# Patient Record
Sex: Female | Born: 2000 | Race: Black or African American | Hispanic: No | Marital: Single | Smoking: Never smoker
Health system: Southern US, Community
[De-identification: ages and names within clinical notes are randomized; demographics above are authoritative.]

---

## 2018-03-31 ENCOUNTER — Encounter (HOSPITAL_COMMUNITY): Payer: Self-pay | Admitting: *Deleted

## 2018-03-31 ENCOUNTER — Emergency Department (HOSPITAL_COMMUNITY)
Admission: EM | Admit: 2018-03-31 | Discharge: 2018-03-31 | Disposition: A | Discharge status: Home / Self Care | Payer: Self-pay | Attending: Emergency Medicine | Admitting: Emergency Medicine

## 2018-03-31 ENCOUNTER — Emergency Department (HOSPITAL_COMMUNITY): Payer: Self-pay

## 2018-03-31 ENCOUNTER — Other Ambulatory Visit: Payer: Self-pay

## 2018-03-31 DIAGNOSIS — R109 Unspecified abdominal pain: Secondary | ICD-10-CM

## 2018-03-31 DIAGNOSIS — Z79899 Other long term (current) drug therapy: Secondary | ICD-10-CM | POA: Insufficient documentation

## 2018-03-31 DIAGNOSIS — R1012 Left upper quadrant pain: Secondary | ICD-10-CM | POA: Insufficient documentation

## 2018-03-31 LAB — URINALYSIS, ROUTINE W REFLEX MICROSCOPIC
Bilirubin Urine: NEGATIVE
GLUCOSE, UA: NEGATIVE mg/dL
Hgb urine dipstick: NEGATIVE
Ketones, ur: NEGATIVE mg/dL
Leukocytes,Ua: NEGATIVE
Nitrite: NEGATIVE
PROTEIN: NEGATIVE mg/dL
Specific Gravity, Urine: 1.015 (ref 1.005–1.030)
pH: 8 (ref 5.0–8.0)

## 2018-03-31 LAB — CBC WITH DIFFERENTIAL/PLATELET
Abs Immature Granulocytes: 0.04 10*3/uL (ref 0.00–0.07)
Basophils Absolute: 0 10*3/uL (ref 0.0–0.1)
Basophils Relative: 0 %
Eosinophils Absolute: 0 10*3/uL (ref 0.0–0.5)
Eosinophils Relative: 1 %
HCT: 39.7 % (ref 36.0–46.0)
HEMOGLOBIN: 12.7 g/dL (ref 12.0–15.0)
Immature Granulocytes: 1 %
LYMPHS PCT: 34 %
Lymphs Abs: 3 10*3/uL (ref 0.7–4.0)
MCH: 29.2 pg (ref 26.0–34.0)
MCHC: 32 g/dL (ref 30.0–36.0)
MCV: 91.3 fL (ref 80.0–100.0)
Monocytes Absolute: 0.5 10*3/uL (ref 0.1–1.0)
Monocytes Relative: 6 %
NEUTROS ABS: 5.2 10*3/uL (ref 1.7–7.7)
Neutrophils Relative %: 58 %
Platelets: 272 10*3/uL (ref 150–400)
RBC: 4.35 MIL/uL (ref 3.87–5.11)
RDW: 13.6 % (ref 11.5–15.5)
WBC: 8.8 10*3/uL (ref 4.0–10.5)
nRBC: 0 % (ref 0.0–0.2)

## 2018-03-31 LAB — POC URINE PREG, ED: Preg Test, Ur: NEGATIVE

## 2018-03-31 LAB — COMPREHENSIVE METABOLIC PANEL
ALT: 16 U/L (ref 0–44)
AST: 17 U/L (ref 15–41)
Albumin: 4.6 g/dL (ref 3.5–5.0)
Alkaline Phosphatase: 57 U/L (ref 38–126)
Anion gap: 9 (ref 5–15)
BILIRUBIN TOTAL: 0.3 mg/dL (ref 0.3–1.2)
BUN: 13 mg/dL (ref 6–20)
CO2: 25 mmol/L (ref 22–32)
Calcium: 9.3 mg/dL (ref 8.9–10.3)
Chloride: 104 mmol/L (ref 98–111)
Creatinine, Ser: 0.87 mg/dL (ref 0.44–1.00)
GFR calc Af Amer: >60 mL/min (ref >60)
Glucose, Bld: 91 mg/dL (ref 70–99)
POTASSIUM: 3.4 mmol/L — AB (ref 3.5–5.1)
Sodium: 138 mmol/L (ref 135–145)
Total Protein: 8.2 g/dL — ABNORMAL HIGH (ref 6.5–8.1)

## 2018-03-31 MED ORDER — SODIUM CHLORIDE 0.9 % IV BOLUS
1000.0000 mL | Freq: Once | INTRAVENOUS | Status: AC
  Administered 2018-03-31: 1000 mL via INTRAVENOUS

## 2018-03-31 MED ORDER — IOPAMIDOL (ISOVUE-300) INJECTION 61%
100.0000 mL | Freq: Once | INTRAVENOUS | Status: AC | PRN
  Administered 2018-03-31: 100 mL via INTRAVENOUS

## 2018-03-31 MED ORDER — ONDANSETRON 4 MG PO TBDP: 4.0000 mg | ORAL_TABLET | Freq: Three times a day (TID) | ORAL | 0 refills | Status: DC | PRN

## 2018-03-31 NOTE — ED Triage Notes (Signed)
Pt reports LUQ pain x 1 week, then started to have pain in her L flank area x 3 days ago with nausea but no vomiting.  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  She also endorses hematuria today.

## 2018-03-31 NOTE — Discharge Instructions (Addendum)
Follow-up with the urologist as soon as possible in this manner call the number provided to set up an appointment. Be sure to drink plenty of water to stay well-hydrated. Antiinflammatory medications: Take 600 mg of ibuprofen every 6 hours or 440 mg (over the counter dose) to 500 mg (prescription dose) of naproxen every 12 hours for the next 3 days. After this time, these medications may be used as needed for pain. Take these medications with food to avoid upset stomach. Choose only one of these medications, do not take them together. Acetaminophen (generic for Tylenol): Should you continue to have additional pain while taking the ibuprofen or naproxen, you may add in acetaminophen as needed. Your daily total maximum amount of acetaminophen from all sources should be limited to 4000mg /day for persons without liver problems, or 2000mg /day for those with liver problems.  Nausea/vomiting: Use the ondansetron (generic for Zofran) for nausea or vomiting.  This medication may not prevent all vomiting or nausea, but can help facilitate better hydration. Things that can help with nausea/vomiting also include peppermint/menthol candies, vitamin B12, and ginger.

## 2018-03-31 NOTE — ED Provider Notes (Signed)
[facility]-EMERGENCY DEPT Provider Note   CSN: [id number] Arrival date & time: 03/31/18  1306     History   Chief Complaint Chief Complaint  Patient presents with  . Abdominal Pain  . Flank Pain    HPI Glenda Jordan is a 18 y.o. female.  HPI   Glenda Jordan is a 18 y.o. female, with a history of renal cyst, presenting to the ED with left lower back pain for the last week.  Accompanied by intermittent hematuria with streaks of blood, but no clots.  She also notes that she may have had some blood in her stool that resolved, but is not sure.  Accompanied by nausea.  Pain waxes and wanes, sharp, moderate to severe, radiating to left flank and left abdomen.  She states she has a history of a renal cyst and wonders if this could be causing her symptoms.  She has had similar symptoms in the past and [place]rolog[place]  She is student at [employer] and she has not contacted her nephrologist about this episode.  She is vague and does not seem to be very clear as to whether or not her nephrologist told her the source of her previous pain and hematuria.   Denies fever/chills, vomiting, diarrhea, chest pain, shortness of breath, abnormal vaginal discharge/bleeding, dysuria, or any other complaints.    History reviewed. No pertinent past medical history.  There are no active problems to display for this patient.   History reviewed. No pertinent surgical history.   OB History   No obstetric history on file.      Home Medications    Prior to Admission medications   Medication Sig Start Date End Date Taking? Authorizing Provider  acetaminophen (TYLENOL) 500 MG tablet Take 500 mg by mouth daily after breakfast.   Yes [provider]  cyclobenzaprine (FLEXERIL) 10 MG tablet Take 10 mg by mouth at bedtime. 10/07/17  Yes [provider]  ondansetron (ZOFRAN ODT) 4 MG disintegrating tablet Take 1 tablet (4 mg  total) by mouth every 8 (eight) hours as needed for nausea or vomiting. 03/31/18   Ellerie Arenz, Hillard Danker, PA-C    Family History No family history on file.  Social History Social History   Tobacco Use  . Smoking status: Never Smoker  . Smokeless tobacco: Never Used  Substance Use Topics  . Alcohol use: Never    Frequency: Never  . Drug use: Never     Allergies   Amoxicillin   Review of Systems Review of Systems  Constitutional: Negative for chills, diaphoresis and fever.  Respiratory: Negative for cough and shortness of breath.   Cardiovascular: Negative for chest pain.  Gastrointestinal: Positive for blood in stool (?) and nausea. Negative for abdominal distention, diarrhea and vomiting.  Genitourinary: Positive for flank pain and hematuria. Negative for dysuria, frequency, vaginal bleeding and vaginal discharge.  Musculoskeletal: Positive for back pain.  Neurological: Negative for syncope, weakness and numbness.  All other systems reviewed and are negative.    Physical Exam Updated Vital Signs BP (!) 133/96 (BP Location: Left Arm)   Pulse (!) 105   Temp 98.8 F (37.1 C) (Oral)   Resp 18   Ht 5\' 3"  (1.6 m)   Wt 80.3 kg   SpO2 100%   BMI 31.35 kg/m   Physical Exam Vitals signs and nursing note reviewed.  Constitutional:      General: She is not in acute  distress.    Appearance: She is well-developed. She is not diaphoretic.  HENT:     Head: Normocephalic and atraumatic.     Mouth/Throat:     Mouth: Mucous membranes are moist.     Pharynx: Oropharynx is clear.  Eyes:     Conjunctiva/sclera: Conjunctivae normal.  Neck:     Musculoskeletal: Neck supple.  Cardiovascular:     Rate and Rhythm: Regular rhythm. Tachycardia present.     Pulses: Normal pulses.     Heart sounds: Normal heart sounds.     Comments: Mildly tachycardic Pulmonary:     Effort: Pulmonary effort is normal. No respiratory distress.     Breath sounds: Normal breath sounds.  Abdominal:      Palpations: Abdomen is soft.     Tenderness: There is abdominal tenderness. There is no guarding.       Comments: Seemingly mild tenderness to the left flank.  Question of left CVA tenderness.  Patient is not very clear about whether or not percussion is painful.  Musculoskeletal:     Right lower leg: No edema.     Left lower leg: No edema.  Lymphadenopathy:     Cervical: No cervical adenopathy.  Skin:    General: Skin is warm and dry.  Neurological:     Mental Status: She is alert.  Psychiatric:        Mood and Affect: Mood and affect normal.        Speech: Speech normal.        Behavior: Behavior normal.      ED Treatments / Results  Labs (all labs ordered are listed, but only abnormal results are displayed) Labs Reviewed  URINALYSIS, ROUTINE W REFLEX MICROSCOPIC - Abnormal; Notable for the following components:      Result Value   APPearance CLOUDY (*)    All other components within normal limits  COMPREHENSIVE METABOLIC PANEL - Abnormal; Notable for the following components:   Potassium 3.4 (*)    Total Protein 8.2 (*)    All other components within normal limits  CBC WITH DIFFERENTIAL/PLATELET  POC URINE PREG, ED    EKG None  Radiology Ct Abdomen Pelvis W Contrast  Result Date: 03/31/2018 CLINICAL DATA:  Left upper quadrant pain for 1 week EXAM: CT ABDOMEN AND PELVIS WITH CONTRAST TECHNIQUE: Multidetector CT imaging of the abdomen and pelvis was performed using the standard protocol following bolus administration of intravenous contrast. CONTRAST:  ISOVUE-300 IOPAMIDOL (ISOVUE-300) INJECTION 61% COMPARISON:  None. FINDINGS: Lower chest: No acute abnormality. Hepatobiliary: Liver is within normal limits. The gallbladder is decompressed. Pancreas: Unremarkable. No pancreatic ductal dilatation or surrounding inflammatory changes. Spleen: Normal in size without focal abnormality. Adrenals/Urinary Tract: Adrenal glands are within normal limits bilaterally. Kidneys  are well visualized bilaterally. Left kidney demonstrates no renal calculi or obstructive changes. Bladder is partially distended. Right kidney demonstrates and outpouching of fluid attenuation with surrounding enhancing cortex. Some adjacent scarring is noted. Delayed images show opacification of this cystic area similar to that seen on the collecting system. 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 Stomach/Bowel: Colon is well distended with mild retained fecal material. No obstructive changes are seen. The appendix is well visualized and within normal limits. No small bowel or gastric abnormality is seen. Vascular/Lymphatic: No significant vascular findings are present. No enlarged abdominal or pelvic lymph nodes. Reproductive: Uterus and bilateral adnexa are unremarkable. Other: No abdominal wall hernia or abnormality. No abdominopelvic ascites. Musculoskeletal: No acute or significant osseous findings.  IMPRESSION: No findings to correspond with the patient's given clinical history of left-sided abdominal pain. 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 No other focal abnormality is noted. Electronically Signed   By: Alcide CleverMark  Lukens M.D.   On: 03/31/2018 20:42    Procedures Procedures (including critical care time)  Medications Ordered in ED Medications  sodium chloride 0.9 % bolus 1,000 mL (0 mLs Intravenous Stopped 03/31/18 2125)  iopamidol (ISOVUE-300) 61 % injection 100 mL (100 mLs Intravenous Contrast Given 03/31/18 2002)     Initial Impression / Assessment and Plan / ED Course  I have reviewed the triage vital signs and the nursing notes.  Pertinent labs & imaging results that were available during my care of the patient were reviewed by me and considered in my medical decision making (see chart for details).     Patient presents with left flank pain and reported hematuria. Patient is nontoxic appearing, afebrile,  not tachycardic, not tachypneic, not hypotensive, maintains excellent SPO2 on room air, and is in no apparent distress.  No leukocytosis.  Normal kidney function.  No hematuria. 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  No pain or symptoms on the right side. Urology follow-up.  Return precautions discussed. Patient voices understanding of these instructions, accepts the plan, and is comfortable with discharge.  Findings and plan of care discussed with Melene Planan Floyd, DO.   Vitals:   03/31/18 1413 03/31/18 1416 03/31/18 1601 03/31/18 2158  BP:  134/89 (!) 133/96 130/88  Pulse:   (!) 105 68  Resp:   18 16  Temp:      TempSrc:      SpO2:   100% 100%  Weight: 80.3 kg     Height: 5\' 3"  (1.6 m)        Final Clinical Impressions(s) / ED Diagnoses   Final diagnoses:  Flank pain    ED Discharge Orders         Ordered    ondansetron (ZOFRAN ODT) 4 MG disintegrating tablet  Every 8 hours PRN     03/31/18 2123           Anselm PancoastJoy, Jamarious Febo C, PA-C 03/31/18 2259    Melene PlanFloyd, Dan, DO 03/31/18 2317

## 2018-03-31 NOTE — ED Notes (Signed)
Patient ambulated to restroom and back to stretcher. Patient reports she voided with no complications. Patient was bladder scanned multiple times with no no urine in bladder.

## 2018-11-24 ENCOUNTER — Other Ambulatory Visit: Payer: Self-pay

## 2018-11-24 ENCOUNTER — Emergency Department (HOSPITAL_COMMUNITY)
Admission: EM | Admit: 2018-11-24 | Discharge: 2018-11-24 | Discharge status: Absent without leave | Payer: Medicaid Other

## 2018-11-24 NOTE — ED Notes (Signed)
I called patient for a second time in the lobby to be triage

## 2018-11-24 NOTE — ED Notes (Signed)
Called patient in the lobby to be triage and no one responded

## 2018-11-25 ENCOUNTER — Encounter (HOSPITAL_COMMUNITY): Payer: Self-pay | Admitting: Emergency Medicine

## 2018-11-25 ENCOUNTER — Emergency Department (HOSPITAL_COMMUNITY)
Admission: EM | Admit: 2018-11-25 | Discharge: 2018-11-25 | Discharge status: Against Medical Advice | Payer: Self-pay | Attending: Emergency Medicine | Admitting: Emergency Medicine

## 2018-11-25 ENCOUNTER — Other Ambulatory Visit: Payer: Self-pay

## 2018-11-25 DIAGNOSIS — Z5321 Procedure and treatment not carried out due to patient leaving prior to being seen by health care provider: Secondary | ICD-10-CM | POA: Insufficient documentation

## 2018-11-25 MED ORDER — SODIUM CHLORIDE 0.9% FLUSH: 3.0000 mL | Freq: Once | INTRAVENOUS | Status: DC

## 2018-11-25 NOTE — ED Triage Notes (Signed)
Pt reports abd pains since Sunday with no other symptoms.

## 2018-12-21 ENCOUNTER — Other Ambulatory Visit: Payer: Self-pay

## 2018-12-21 ENCOUNTER — Emergency Department (HOSPITAL_COMMUNITY)
Admission: EM | Admit: 2018-12-21 | Discharge: 2018-12-21 | Disposition: A | Discharge status: Home / Self Care | Payer: Self-pay | Attending: Emergency Medicine | Admitting: Emergency Medicine

## 2018-12-21 ENCOUNTER — Encounter (HOSPITAL_COMMUNITY): Payer: Self-pay | Admitting: *Deleted

## 2018-12-21 DIAGNOSIS — J069 Acute upper respiratory infection, unspecified: Secondary | ICD-10-CM | POA: Insufficient documentation

## 2018-12-21 DIAGNOSIS — H9203 Otalgia, bilateral: Secondary | ICD-10-CM | POA: Insufficient documentation

## 2018-12-21 DIAGNOSIS — R0981 Nasal congestion: Secondary | ICD-10-CM | POA: Insufficient documentation

## 2018-12-21 DIAGNOSIS — R42 Dizziness and giddiness: Secondary | ICD-10-CM | POA: Insufficient documentation

## 2018-12-21 MED ORDER — FLUTICASONE PROPIONATE 50 MCG/ACT NA SUSP: 1.0000 | Freq: Every day | NASAL | 2 refills | Status: AC

## 2018-12-21 MED ORDER — CETIRIZINE-PSEUDOEPHEDRINE ER 5-120 MG PO TB12: 1.0000 | ORAL_TABLET | Freq: Every day | ORAL | 0 refills | Status: AC

## 2018-12-21 MED ORDER — DEXAMETHASONE 1 MG/ML PO CONC
10.0000 mg | Freq: Once | ORAL | Status: AC
  Administered 2018-12-21: 10 mg via ORAL
  Filled 2018-12-21: qty 10

## 2018-12-21 MED ORDER — IBUPROFEN 100 MG/5ML PO SUSP
800.0000 mg | Freq: Once | ORAL | Status: AC
  Administered 2018-12-21: 19:00:00 800 mg via ORAL
  Filled 2018-12-21: qty 40

## 2018-12-21 NOTE — ED Triage Notes (Signed)
Pt states she has had a sore throat since last Thursday. She went to the [facility] [employer] today, tested neg for strep and Covid 19. She spoke with her mother and since they prescribe any meds, for her to come to the ED

## 2018-12-21 NOTE — Discharge Instructions (Signed)
Take Flonase and Zyrtec daily. Take Motrin and Tylenol as needed as directed. Follow-up with student [facility] if symptoms continue.

## 2018-12-21 NOTE — ED Provider Notes (Signed)
[facility] DEPT Provider Note   CSN: [id number] Arrival date & time: 12/21/18  1729     History   Chief Complaint Chief Complaint  Patient presents with  . Sore Throat    HPI Glenda Jordan is a 18 y.o. female.     18 year old female presents with complaint of sore throat, nonproductive cough, sinus congestion, ear pressure.  Patient went to [facility] today and had a negative Covid test, negative mono test, negative strep test with throat culture pending.  Patient reports onset of symptoms 10 days ago, no known sick contacts.  Patient states that she was at work today when she felt a little dizzy, called her mom and was told to go to the emergency room for evaluation.  Patient states dizziness has resolved, continues with previously mentioned symptoms.  Glenda Jordan was evaluated in Emergency Department on 12/21/2018 for the symptoms described in the history of present illness. She was evaluated in the context of the global COVID-19 pandemic, which necessitated consideration that the patient might be at risk for infection with the SARS-CoV-2 virus that causes COVID-19. Institutional protocols and algorithms that pertain to the evaluation of patients at risk for COVID-19 are in a state of rapid change based on information released by regulatory bodies including the CDC and federal and state organizations. These policies and algorithms were followed during the patient's care in the ED.      History reviewed. No pertinent past medical history.  There are no active problems to display for this patient.   History reviewed. No pertinent surgical history.   OB History   No obstetric history on file.      Home Medications    Prior to Admission medications   Medication Sig Start Date End Date Taking? Authorizing Provider  acetaminophen (TYLENOL) 500 MG tablet Take 500 mg by mouth daily after breakfast.    [provider]   cetirizine-pseudoephedrine (ZYRTEC-D) 5-120 MG tablet Take 1 tablet by mouth daily. 12/21/18   Tacy Learn, PA-C  cyclobenzaprine (FLEXERIL) 10 MG tablet Take 10 mg by mouth at bedtime. 10/07/17   [provider]  fluticasone (FLONASE) 50 MCG/ACT nasal spray Place 1 spray into both nostrils daily. 12/21/18   Tacy Learn, PA-C  ondansetron (ZOFRAN ODT) 4 MG disintegrating tablet Take 1 tablet (4 mg total) by mouth every 8 (eight) hours as needed for nausea or vomiting. 03/31/18   Joy, Helane Gunther, PA-C    Family History No family history on file.  Social History Social History   Tobacco Use  . Smoking status: Never Smoker  . Smokeless tobacco: Never Used  Substance Use Topics  . Alcohol use: Never    Frequency: Never  . Drug use: Never     Allergies   Amoxicillin   Review of Systems Review of Systems  Constitutional: Negative for chills, diaphoresis and fever.  HENT: Positive for congestion, ear pain, sinus pressure and sore throat. Negative for sneezing.   Respiratory: Positive for cough.   Gastrointestinal: Negative for nausea and vomiting.  Musculoskeletal: Negative for back pain and myalgias.  Skin: Negative for rash and wound.  Allergic/Immunologic: Negative for immunocompromised state.  Neurological: Positive for dizziness. Negative for weakness and headaches.  Psychiatric/Behavioral: Negative for confusion.  All other systems reviewed and are negative.    Physical Exam Updated Vital Signs BP (!) 128/95 (BP Location: Left Arm)   Pulse 89   Temp 98.3 F (36.8 C)   Resp 17  SpO2 98%   Physical Exam Vitals signs and nursing note reviewed.  Constitutional:      General: She is not in acute distress.    Appearance: She is well-developed. She is not diaphoretic.  HENT:     Head: Normocephalic and atraumatic.     Right Ear: Ear canal normal. A middle ear effusion is present. Tympanic membrane is not erythematous.     Left Ear: Ear canal normal. A  middle ear effusion is present. Tympanic membrane is not erythematous.     Nose: Congestion present.     Mouth/Throat:     Mouth: Mucous membranes are moist.     Pharynx: Uvula midline. No oropharyngeal exudate or uvula swelling.     Tonsils: No tonsillar exudate. 1+ on the right. 1+ on the left.  Eyes:     Conjunctiva/sclera: Conjunctivae normal.  Neck:     Musculoskeletal: Neck supple.  Cardiovascular:     Rate and Rhythm: Normal rate and regular rhythm.     Heart sounds: Normal heart sounds.  Pulmonary:     Effort: Pulmonary effort is normal.     Breath sounds: Normal breath sounds.  Lymphadenopathy:     Cervical: No cervical adenopathy.  Skin:    General: Skin is warm and dry.     Findings: No rash.  Neurological:     Mental Status: She is alert and oriented to person, place, and time.  Psychiatric:        Behavior: Behavior normal.      ED Treatments / Results  Labs (all labs ordered are listed, but only abnormal results are displayed) Labs Reviewed - No data to display  EKG None  Radiology No results found.  Procedures Procedures (including critical care time)  Medications Ordered in ED Medications  ibuprofen (ADVIL) 100 MG/5ML suspension 800 mg (has no administration in time range)  dexamethasone (DECADRON) 1 MG/ML solution 10 mg (has no administration in time range)     Initial Impression / Assessment and Plan / ED Course  I have reviewed the triage vital signs and the nursing notes.  Pertinent labs & imaging results that were available during my care of the patient were reviewed by me and considered in my medical decision making (see chart for details).  Clinical Course as of Dec 21 1815  Mon Dec 21, 2018  3875 18 year old female presents with complaint of URI symptoms x10 days.  Patient went to [facility] today and had a negative Covid, strep, mono test today.  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  Dizziness has resolved.  On exam patient has bilateral ear effusions without acute otitis media.  Suspect viral URI.  Will give prescription for Flonase and Zyrtec-D, also given dose of Motrin and Decadron while in the ER.  Patient advised to palpation help if her symptoms persist.   [LM]    Clinical Course User Index [LM] Jeannie Fend, PA-C      Final Clinical Impressions(s) / ED Diagnoses   Final diagnoses:  Viral upper respiratory tract infection    ED Discharge Orders         Ordered    cetirizine-pseudoephedrine (ZYRTEC-D) 5-120 MG tablet  Daily     12/21/18 1816    fluticasone (FLONASE) 50 MCG/ACT nasal spray  Daily     12/21/18 1816  Jeannie FendMurphy, Maite Burlison A, PA-C 12/21/18 Celestia [id number]    Knapp, Jon, MD 12/22/18 [phone]

## 2019-03-17 ENCOUNTER — Encounter (HOSPITAL_COMMUNITY): Payer: Self-pay | Admitting: Emergency Medicine

## 2019-03-17 ENCOUNTER — Other Ambulatory Visit: Payer: Self-pay

## 2019-03-17 DIAGNOSIS — Z79899 Other long term (current) drug therapy: Secondary | ICD-10-CM | POA: Insufficient documentation

## 2019-03-17 DIAGNOSIS — R109 Unspecified abdominal pain: Secondary | ICD-10-CM | POA: Insufficient documentation

## 2019-03-17 DIAGNOSIS — R112 Nausea with vomiting, unspecified: Secondary | ICD-10-CM | POA: Insufficient documentation

## 2019-03-17 DIAGNOSIS — R197 Diarrhea, unspecified: Secondary | ICD-10-CM | POA: Insufficient documentation

## 2019-03-17 DIAGNOSIS — Z20822 Contact with and (suspected) exposure to covid-19: Secondary | ICD-10-CM | POA: Insufficient documentation

## 2019-03-17 MED ORDER — SODIUM CHLORIDE 0.9% FLUSH
3.0000 mL | Freq: Once | INTRAVENOUS | Status: AC
  Administered 2019-03-18: 3 mL via INTRAVENOUS

## 2019-03-17 NOTE — ED Triage Notes (Signed)
Patient complaining of abdominal pain, vomiting, and nausea. Patient states this started on Monday.

## 2019-03-18 ENCOUNTER — Emergency Department (HOSPITAL_COMMUNITY): Payer: Self-pay

## 2019-03-18 ENCOUNTER — Emergency Department (HOSPITAL_COMMUNITY)
Admission: EM | Admit: 2019-03-18 | Discharge: 2019-03-18 | Disposition: A | Discharge status: Home / Self Care | Payer: Self-pay | Attending: Emergency Medicine | Admitting: Emergency Medicine

## 2019-03-18 DIAGNOSIS — R112 Nausea with vomiting, unspecified: Secondary | ICD-10-CM

## 2019-03-18 LAB — URINALYSIS, ROUTINE W REFLEX MICROSCOPIC
Bilirubin Urine: NEGATIVE
Glucose, UA: NEGATIVE mg/dL
Hgb urine dipstick: NEGATIVE
Ketones, ur: NEGATIVE mg/dL
Leukocytes,Ua: NEGATIVE
Nitrite: NEGATIVE
Protein, ur: 30 mg/dL — AB
Specific Gravity, Urine: 1.027 (ref 1.005–1.030)
pH: 6 (ref 5.0–8.0)

## 2019-03-18 LAB — COMPREHENSIVE METABOLIC PANEL
ALT: 17 U/L (ref 0–44)
AST: 16 U/L (ref 15–41)
Albumin: 4.3 g/dL (ref 3.5–5.0)
Alkaline Phosphatase: 54 U/L (ref 38–126)
Anion gap: 9 (ref 5–15)
BUN: 11 mg/dL (ref 6–20)
CO2: 25 mmol/L (ref 22–32)
Calcium: 9.1 mg/dL (ref 8.9–10.3)
Chloride: 105 mmol/L (ref 98–111)
Creatinine, Ser: 0.97 mg/dL (ref 0.44–1.00)
GFR calc Af Amer: >60 mL/min (ref >60)
GFR calc non Af Amer: >60 mL/min (ref >60)
Glucose, Bld: 91 mg/dL (ref 70–99)
Potassium: 3.5 mmol/L (ref 3.5–5.1)
Sodium: 139 mmol/L (ref 135–145)
Total Bilirubin: 0.6 mg/dL (ref 0.3–1.2)
Total Protein: 7.6 g/dL (ref 6.5–8.1)

## 2019-03-18 LAB — CBC
HCT: 36.7 % (ref 36.0–46.0)
Hemoglobin: 12.4 g/dL (ref 12.0–15.0)
MCH: 30.8 pg (ref 26.0–34.0)
MCHC: 33.8 g/dL (ref 30.0–36.0)
MCV: 91.1 fL (ref 80.0–100.0)
Platelets: 245 10*3/uL (ref 150–400)
RBC: 4.03 MIL/uL (ref 3.87–5.11)
RDW: 13.1 % (ref 11.5–15.5)
WBC: 9 10*3/uL (ref 4.0–10.5)
nRBC: 0 % (ref 0.0–0.2)

## 2019-03-18 LAB — I-STAT BETA HCG BLOOD, ED (MC, WL, AP ONLY): I-stat hCG, quantitative: <5 m[IU]/mL (ref <5)

## 2019-03-18 LAB — LIPASE, BLOOD: Lipase: 29 U/L (ref 11–51)

## 2019-03-18 LAB — URINE CULTURE

## 2019-03-18 MED ORDER — SODIUM CHLORIDE 0.9 % IV BOLUS
1000.0000 mL | Freq: Once | INTRAVENOUS | Status: AC
  Administered 2019-03-18: 1000 mL via INTRAVENOUS

## 2019-03-18 MED ORDER — ONDANSETRON HCL 4 MG/2ML IJ SOLN
4.0000 mg | Freq: Once | INTRAMUSCULAR | Status: AC
  Administered 2019-03-18: 4 mg via INTRAVENOUS
  Filled 2019-03-18: qty 2

## 2019-03-18 MED ORDER — ONDANSETRON 4 MG PO TBDP: 4.0000 mg | ORAL_TABLET | Freq: Three times a day (TID) | ORAL | 0 refills | Status: AC | PRN

## 2019-03-18 NOTE — ED Provider Notes (Signed)
[facility]-EMERGENCY DEPT Provider Note   CSN: [id number] Arrival date & time: 03/17/19  2333     History Chief Complaint  Patient presents with  . Nausea  . Abdominal Pain    Glenda Jordan is a 19 y.o. female.  The history is provided by the patient and medical records. No language interpreter was used.  Emesis Severity:  Severe Duration:  4 days Timing:  Constant Quality:  Stomach contents Progression:  Unchanged Chronicity:  New Recent urination:  Normal Relieved by:  Nothing Worsened by:  Nothing Ineffective treatments:  None tried Associated symptoms: abdominal pain, chills, cough, diarrhea, headaches and URI   Associated symptoms: no fever and no sore throat        History reviewed. No pertinent past medical history.  There are no problems to display for this patient.   History reviewed. No pertinent surgical history.   OB History   No obstetric history on file.     History reviewed. No pertinent family history.  Social History   Tobacco Use  . Smoking status: Never Smoker  . Smokeless tobacco: Never Used  Substance Use Topics  . Alcohol use: Never  . Drug use: Never    Home Medications Prior to Admission medications   Medication Sig Start Date End Date Taking? Authorizing Provider  acetaminophen (TYLENOL) 500 MG tablet Take 500 mg by mouth daily after breakfast.    [provider]  cetirizine-pseudoephedrine (ZYRTEC-D) 5-120 MG tablet Take 1 tablet by mouth daily. 12/21/18   Jeannie Fend, PA-C  cyclobenzaprine (FLEXERIL) 10 MG tablet Take 10 mg by mouth at bedtime. 10/07/17   [provider]  fluticasone (FLONASE) 50 MCG/ACT nasal spray Place 1 spray into both nostrils daily. 12/21/18   Jeannie Fend, PA-C  ondansetron (ZOFRAN ODT) 4 MG disintegrating tablet Take 1 tablet (4 mg total) by mouth every 8 (eight) hours as needed for nausea or vomiting. 03/31/18   Joy, Shawn C, PA-C    Allergies      Amoxicillin and Other  Review of Systems   Review of Systems  Constitutional: Positive for chills and fatigue. Negative for diaphoresis and fever.  HENT: Negative for congestion and sore throat.   Eyes: Negative for visual disturbance.  Respiratory: Positive for cough. Negative for chest tightness, shortness of breath, wheezing and stridor.   Cardiovascular: Negative for chest pain, palpitations and leg swelling.  Gastrointestinal: Positive for abdominal pain, diarrhea, nausea and vomiting. Negative for constipation.  Genitourinary: Negative for dysuria.  Musculoskeletal: Negative for back pain and neck pain.  Neurological: Positive for headaches. Negative for dizziness, weakness and light-headedness.  Psychiatric/Behavioral: Negative for agitation.  All other systems reviewed and are negative.   Physical Exam Updated Vital Signs BP (!) 154/99 (BP Location: Left Arm)   Pulse 95   Temp 98.1 F (36.7 C) (Oral)   Resp 19   Ht 5\' 3"  (1.6 m)   Wt 89.8 kg   SpO2 100%   BMI 35.07 kg/m   Physical Exam Vitals and nursing note reviewed.  Constitutional:      General: She is not in acute distress.    Appearance: She is well-developed. She is not ill-appearing, toxic-appearing or diaphoretic.  HENT:     Head: Normocephalic and atraumatic.     Right Ear: External ear normal.     Left Ear: External ear normal.     Nose: Nose normal.     Mouth/Throat:     Mouth: Mucous membranes are  moist.     Pharynx: No pharyngeal swelling or oropharyngeal exudate.  Eyes:     General: No scleral icterus.    Extraocular Movements: Extraocular movements intact.     Conjunctiva/sclera: Conjunctivae normal.     Pupils: Pupils are equal, round, and reactive to light.  Cardiovascular:     Rate and Rhythm: Normal rate.     Heart sounds: Normal heart sounds. No murmur.  Pulmonary:     Effort: Pulmonary effort is normal. No respiratory distress.     Breath sounds: No stridor. No wheezing, rhonchi or  rales.  Chest:     Chest wall: No tenderness.  Abdominal:     General: Bowel sounds are normal. There is no distension.     Palpations: Abdomen is soft.     Tenderness: There is no abdominal tenderness. There is no right CVA tenderness, left CVA tenderness, guarding or rebound.  Musculoskeletal:     Cervical back: Normal range of motion and neck supple.  Skin:    General: Skin is warm.     Findings: No erythema or rash.  Neurological:     General: No focal deficit present.     Mental Status: She is alert and oriented to person, place, and time.     Motor: No abnormal muscle tone.     Coordination: Coordination normal.     Deep Tendon Reflexes: Reflexes are normal and symmetric.  Psychiatric:        Mood and Affect: Mood normal.     ED Results / Procedures / Treatments   Labs (all labs ordered are listed, but only abnormal results are displayed) Labs Reviewed  URINE CULTURE  NOVEL CORONAVIRUS, NAA (HOSP ORDER, SEND-OUT TO REF LAB; TAT 18-24 HRS)  LIPASE, BLOOD  COMPREHENSIVE METABOLIC PANEL  CBC  URINALYSIS, ROUTINE W REFLEX MICROSCOPIC  I-STAT BETA HCG BLOOD, ED ([facility], [facility], [facility] ONLY)    EKG None  Radiology No results found.  Procedures Procedures (including critical care time)  Medications Ordered in ED Medications  sodium chloride flush (NS) 0.9 % injection 3 mL (has no administration in time range)  sodium chloride 0.9 % bolus 1,000 mL (has no administration in time range)  ondansetron (ZOFRAN) injection 4 mg (has no administration in time range)    ED Course  I have reviewed the triage vital signs and the nursing notes.  Pertinent labs & imaging results that were available during my care of the patient were reviewed by me and considered in my medical decision making (see chart for details).    MDM Rules/Calculators/A&P                      Glenda Jordan is a 19 y.o. female with no significant past medical history who presents with several days of chills,  productive cough, nausea, vomiting, diarrhea, and abdominal cramping.  She reports that her symptoms been ongoing for the last few days and she is unsure about Covid contacts.  She reports that she works at [employer] but tries to keep protected.  She reports has had nausea, vomiting, diarrhea for several days and is feeling slightly dehydrated.  She denies any change in urination.  She denies any trauma.  She reports that she has had some productive cough and denies current chest pain or shortness of breath.  Her vital signs were slightly hypertensive on arrival but otherwise she was not hypoxic or febrile.  She denies history of gallbladder disease or appendicitis.  No history  of diverticulitis.  No other complaints.  On exam, lungs are clear and chest is nontender.  Abdomen was nontender on my exam.  Normal bowel sounds.  No CVA tenderness or flank tenderness.  No back tenderness.  Patient resting comfortably.  We will send out a Covid test given the ongoing pandemic and her symptoms.  Will get chest x-ray to look for pneumonia.  Will get screening labs to look for dehydration given the nausea vomiting, diarrhea and will give her nausea medicine and fluids.  If labs are reassuring, anticipate discharge home for outpatient follow-up.  Will likely provide prescription for nausea medication.    Care signed out to oncoming team.   Final Clinical Impression(s) / ED Diagnoses Final diagnoses:  Nausea vomiting and diarrhea    Rx / DC Orders ED Discharge Orders         Ordered    ondansetron (ZOFRAN ODT) 4 MG disintegrating tablet  Every 8 hours PRN     03/18/19 0504         Clinical Impression: 1. Nausea vomiting and diarrhea     Disposition: Care signed out to oncoming team.   This note was prepared with assistance of Dragon voice recognition software. Occasional wrong-word or sound-a-like substitutions may have occurred due to the inherent limitations of voice recognition software.       Zacharias Ridling, Gwenyth Allegra, MD 03/19/19 [phone]

## 2019-03-18 NOTE — ED Provider Notes (Signed)
I assumed care of this patient from Dr. Rush Landmark.  Please see their note for further details of Hx, PE.  Briefly patient is a 19 y.o. female who presented Abd pain, N/V/D. Labs reassuring. Pending UA. Treated symptomatically. Plan for PO challenge.  UA negative. Tolerated oral intake. Send out COVID pending.   The patient appears reasonably screened and/or stabilized for discharge and I doubt any other medical condition or other [facility] requiring further screening, evaluation, or treatment in the ED at this time prior to discharge.  Disposition: Discharge  Condition: Good  I have discussed the results, Dx and Tx plan with the patient who expressed understanding and agree(s) with the plan. Discharge instructions discussed at great length. The patient was given strict return precautions who verbalized understanding of the instructions. No further questions at time of discharge.    ED Discharge Orders         Ordered    ondansetron (ZOFRAN ODT) 4 MG disintegrating tablet  Every 8 hours PRN     03/18/19 0504          Follow Up: Primary care provider  Schedule an appointment as soon as possible for a visit  If you do not have a primary care physician, contact [facility] at [phone] for referral          Adaleena Mooers, Amadeo Garnet, MD 03/18/19 0505

## 2019-03-19 LAB — NOVEL CORONAVIRUS, NAA (HOSP ORDER, SEND-OUT TO REF LAB; TAT 18-24 HRS): SARS-CoV-2, NAA: NOT DETECTED

## 2019-04-24 ENCOUNTER — Other Ambulatory Visit: Payer: Self-pay

## 2019-04-24 ENCOUNTER — Emergency Department (HOSPITAL_COMMUNITY)
Admission: EM | Admit: 2019-04-24 | Discharge: 2019-04-24 | Disposition: A | Discharge status: Home / Self Care | Payer: BC Managed Care – PPO | Attending: Emergency Medicine | Admitting: Emergency Medicine

## 2019-04-24 ENCOUNTER — Emergency Department (HOSPITAL_COMMUNITY): Payer: BC Managed Care – PPO

## 2019-04-24 ENCOUNTER — Encounter (HOSPITAL_COMMUNITY): Payer: Self-pay

## 2019-04-24 DIAGNOSIS — M25532 Pain in left wrist: Secondary | ICD-10-CM | POA: Diagnosis not present

## 2019-04-24 DIAGNOSIS — X509XXA Other and unspecified overexertion or strenuous movements or postures, initial encounter: Secondary | ICD-10-CM | POA: Diagnosis not present

## 2019-04-24 DIAGNOSIS — Y9289 Other specified places as the place of occurrence of the external cause: Secondary | ICD-10-CM | POA: Diagnosis not present

## 2019-04-24 DIAGNOSIS — Z79899 Other long term (current) drug therapy: Secondary | ICD-10-CM | POA: Diagnosis not present

## 2019-04-24 DIAGNOSIS — Y9389 Activity, other specified: Secondary | ICD-10-CM | POA: Diagnosis not present

## 2019-04-24 DIAGNOSIS — R202 Paresthesia of skin: Secondary | ICD-10-CM | POA: Diagnosis not present

## 2019-04-24 DIAGNOSIS — Y990 Civilian activity done for income or pay: Secondary | ICD-10-CM | POA: Insufficient documentation

## 2019-04-24 MED ORDER — IBUPROFEN 200 MG PO TABS
600.0000 mg | ORAL_TABLET | Freq: Once | ORAL | Status: AC
  Administered 2019-04-24: 600 mg via ORAL
  Filled 2019-04-24: qty 3

## 2019-04-24 NOTE — ED Notes (Signed)
Pt verbalizes understanding of DC instructions. Pt belongings returned and is ambulatory out of ED.    signature pad not available

## 2019-04-24 NOTE — ED Triage Notes (Signed)
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 Pt states she injured her arm last week and hurt it again last night while at work.

## 2019-04-24 NOTE — ED Provider Notes (Signed)
[facility] DEPT Provider Note   CSN: [id number] Arrival date & time: 04/24/19  1951     History Chief Complaint  Patient presents with  . Arm Pain    Glenda Jordan is a 19 y.o. female.  Glenda Jordan is a 19 y.o. female who is otherwise healthy, presents to the ED for evaluation of left wrist pain.  She reports she works at [employer] and was pushing a shopping carts about a week ago when she hurt her left wrist.  She states she thinks she bent it in a nondirection and she has had intermittent pain since then, it seems to be getting better but then last night while at work she seemed to hurt it again and pain has been worse throughout the day today.  Pain is primarily present over the ulnar aspect of the left wrist.  She reports last night it was a bit swollen but that has improved today.  She had some tingling in the fingers that has since resolved, no numbness or weakness.  She denies any redness or warmth, no fevers or chills.  No previous history of injury or surgery to the wrist.  She has applied some heat with some improvement, has not taken any medication to try and treat symptoms prior to arrival.        History reviewed. No pertinent past medical history.  There are no problems to display for this patient.   History reviewed. No pertinent surgical history.   OB History   No obstetric history on file.     History reviewed. No pertinent family history.  Social History   Tobacco Use  . Smoking status: Never Smoker  . Smokeless tobacco: Never Used  Substance Use Topics  . Alcohol use: Never  . Drug use: Never    Home Medications Prior to Admission medications   Medication Sig Start Date End Date Taking? Authorizing Provider  cetirizine-pseudoephedrine (ZYRTEC-D) 5-120 MG tablet Take 1 tablet by mouth daily. Patient not taking: Reported on 03/18/2019 12/21/18   Suella Broad A, PA-C  fluticasone [facility]) 50 MCG/ACT nasal spray Place  1 spray into both nostrils daily. 12/21/18   Tacy Learn, PA-C  pantoprazole (PROTONIX) 40 MG tablet Take 40 mg by mouth every morning. 09/30/18   [provider]    Allergies    Amoxicillin  Review of Systems   Review of Systems  Constitutional: Negative for chills and fever.  Musculoskeletal: Positive for arthralgias and joint swelling.  Skin: Negative for color change and rash.  Neurological: Negative for weakness and numbness.    Physical Exam Updated Vital Signs BP (!) 153/92 (BP Location: Right Arm)   Pulse 97   Temp 98.2 F (36.8 C) (Oral)   Resp 18   SpO2 97%   Physical Exam Vitals and nursing note reviewed.  Constitutional:      General: She is not in acute distress.    Appearance: Normal appearance. She is well-developed and normal weight. She is not diaphoretic.  HENT:     Head: Normocephalic and atraumatic.  Eyes:     General:        Right eye: No discharge.        Left eye: No discharge.  Pulmonary:     Effort: Pulmonary effort is normal. No respiratory distress.  Musculoskeletal:     Comments: Tenderness to palpation over the left wrist without palpable deformity or swelling, no overlying erythema or warmth.  Able to range wrist with some  discomfort.  2+ radial pulse and good cap refill, cardinal hand movements intact, normal sensation and strength.  Skin:    General: Skin is warm and dry.     Capillary Refill: Capillary refill takes less than 2 seconds.  Neurological:     Mental Status: She is alert and oriented to person, place, and time.     Coordination: Coordination normal.  Psychiatric:        Mood and Affect: Mood normal.        Behavior: Behavior normal.     ED Results / Procedures / Treatments   Labs (all labs ordered are listed, but only abnormal results are displayed) Labs Reviewed - No data to display  EKG None  Radiology DG Wrist Complete Left  Result Date: 04/24/2019 CLINICAL DATA:  Pt reports she hurt her left arm  while pushing shopping carts. Pt states she injured her arm last week and hurt it again last night while at work. EXAM: LEFT WRIST - COMPLETE 3+ VIEW COMPARISON:  None. FINDINGS: There is no evidence of fracture or dislocation. There is no evidence of arthropathy or other focal bone abnormality. Soft tissues are unremarkable. IMPRESSION: Negative radiographs of the left wrist. Electronically Signed   By: Emmaline Kluver M.D.   On: 04/24/2019 20:33    Procedures Procedures (including critical care time)  Medications Ordered in ED Medications  ibuprofen (ADVIL) tablet 600 mg (600 mg Oral Given 04/24/19 2054)    ED Course  I have reviewed the triage vital signs and the nursing notes.  Pertinent labs & imaging results that were available during my care of the patient were reviewed by me and considered in my medical decision making (see chart for details).    MDM Rules/Calculators/A&P                      19 year old female presents with wrist pain after she injured it while pushing shopping carts at work.  She has no obvious deformity and the wrist is neurovascularly intact.  X-ray without evidence of fracture.  No evidence of septic arthritis.  Suspect sprain of the wrist.  Patient placed in Velcro wrist splint encouraged to use ibuprofen and Tylenol, ice and elevation, sports medicine follow-up if not improving.  Return precautions discussed.  Patient expresses understanding and agreement with plan.  Discharged home in good condition.  Final Clinical Impression(s) / ED Diagnoses Final diagnoses:  Left wrist pain    Rx / DC Orders ED Discharge Orders    None       Legrand Rams 04/24/19 2057    Charlynne Pander, MD 04/25/19 [phone]

## 2019-04-24 NOTE — Discharge Instructions (Signed)
Your x-ray does not show any evidence of fracture, I suspect you have sprained your wrist.  Please wear your wrist brace for the next week, take ibuprofen 600 mg every 6 hours, Tylenol 1000 mg every 6 hours as needed for pain.  If symptoms or not improving after 1 week please follow-up with Dr. Pearletha Forge with sports medicine.

## 2019-06-03 ENCOUNTER — Ambulatory Visit: Payer: BC Managed Care – PPO | Attending: Family

## 2019-06-03 DIAGNOSIS — Z23 Encounter for immunization: Secondary | ICD-10-CM

## 2019-06-03 NOTE — Progress Notes (Signed)
   Covid-19 Vaccination Clinic  Name:  Lindy Garczynski    MRN: [id number] DOB: 2000/09/20  06/03/2019  Ms. Bittman was observed post Covid-19 immunization for 15 minutes without incident. She was provided with Vaccine Information Sheet and instruction to access the V-Safe system.   Ms. Mulroy was instructed to call 911 with any severe reactions post vaccine: Marland Kitchen Difficulty breathing  . Swelling of face and throat  . A fast heartbeat  . A bad rash all over body  . Dizziness and weakness   Immunizations Administered    Name Date Dose VIS Date Route   Moderna COVID-19 Vaccine 06/03/2019 10:39 AM 0.5 mL 01/19/2019 Intramuscular   Manufacturer: Moderna   Lot: [id number]   NDC: 14782-956-21

## 2019-06-06 ENCOUNTER — Encounter (HOSPITAL_COMMUNITY): Payer: Self-pay | Admitting: Emergency Medicine

## 2019-06-06 ENCOUNTER — Emergency Department (HOSPITAL_COMMUNITY)
Admission: EM | Admit: 2019-06-06 | Discharge: 2019-06-06 | Disposition: A | Discharge status: Home / Self Care | Payer: BC Managed Care – PPO | Attending: Emergency Medicine | Admitting: Emergency Medicine

## 2019-06-06 ENCOUNTER — Other Ambulatory Visit: Payer: Self-pay

## 2019-06-06 ENCOUNTER — Emergency Department (HOSPITAL_COMMUNITY): Payer: BC Managed Care – PPO

## 2019-06-06 DIAGNOSIS — M940 Chondrocostal junction syndrome [Tietze]: Secondary | ICD-10-CM

## 2019-06-06 DIAGNOSIS — R0602 Shortness of breath: Secondary | ICD-10-CM | POA: Insufficient documentation

## 2019-06-06 DIAGNOSIS — R0789 Other chest pain: Secondary | ICD-10-CM

## 2019-06-06 DIAGNOSIS — R079 Chest pain, unspecified: Secondary | ICD-10-CM | POA: Diagnosis present

## 2019-06-06 LAB — CBC WITH DIFFERENTIAL/PLATELET
Abs Immature Granulocytes: 0 10*3/uL (ref 0.00–0.07)
Basophils Absolute: 0 10*3/uL (ref 0.0–0.1)
Basophils Relative: 0 %
Eosinophils Absolute: 0.1 10*3/uL (ref 0.0–0.5)
Eosinophils Relative: 2 %
HCT: 37.8 % (ref 36.0–46.0)
Hemoglobin: 12.2 g/dL (ref 12.0–15.0)
Immature Granulocytes: 0 %
Lymphocytes Relative: 32 %
Lymphs Abs: 1.5 10*3/uL (ref 0.7–4.0)
MCH: 29.7 pg (ref 26.0–34.0)
MCHC: 32.3 g/dL (ref 30.0–36.0)
MCV: 92 fL (ref 80.0–100.0)
Monocytes Absolute: 0.5 10*3/uL (ref 0.1–1.0)
Monocytes Relative: 10 %
Neutro Abs: 2.7 10*3/uL (ref 1.7–7.7)
Neutrophils Relative %: 56 %
Platelets: 211 10*3/uL (ref 150–400)
RBC: 4.11 MIL/uL (ref 3.87–5.11)
RDW: 13 % (ref 11.5–15.5)
WBC: 4.8 10*3/uL (ref 4.0–10.5)
nRBC: 0 % (ref 0.0–0.2)

## 2019-06-06 LAB — BASIC METABOLIC PANEL
Anion gap: 9 (ref 5–15)
BUN: 13 mg/dL (ref 6–20)
CO2: 28 mmol/L (ref 22–32)
Calcium: 9.1 mg/dL (ref 8.9–10.3)
Chloride: 102 mmol/L (ref 98–111)
Creatinine, Ser: 0.78 mg/dL (ref 0.44–1.00)
GFR calc Af Amer: >60 mL/min (ref >60)
GFR calc non Af Amer: >60 mL/min (ref >60)
Glucose, Bld: 91 mg/dL (ref 70–99)
Potassium: 4.2 mmol/L (ref 3.5–5.1)
Sodium: 139 mmol/L (ref 135–145)

## 2019-06-06 LAB — D-DIMER, QUANTITATIVE: D-Dimer, Quant: <0.27 ug/mL-FEU (ref 0.00–0.50)

## 2019-06-06 LAB — I-STAT BETA HCG BLOOD, ED (MC, WL, AP ONLY): I-stat hCG, quantitative: <5 m[IU]/mL (ref <5)

## 2019-06-06 MED ORDER — NAPROXEN 375 MG PO TABS: 375.0000 mg | ORAL_TABLET | Freq: Two times a day (BID) | ORAL | 0 refills | Status: AC

## 2019-06-06 MED ORDER — NAPROXEN 500 MG PO TABS
500.0000 mg | ORAL_TABLET | Freq: Once | ORAL | Status: AC
  Administered 2019-06-06: 500 mg via ORAL
  Filled 2019-06-06: qty 1

## 2019-06-06 NOTE — Discharge Instructions (Signed)
Take naproxen 2 times a day with meals.  Do not take other anti-inflammatories at the same time (Advil, Motrin, ibuprofen, Aleve). You may supplement with Tylenol if you need further pain control. Make sure you are staying well-hydrated with water. Follow-up with your primary care doctor for symptoms not proven. Return to the emergency room if any new, worsening, concerning symptoms.

## 2019-06-06 NOTE — ED Triage Notes (Signed)
Per pt, states chest and back pain on and off since Friday-states episodes don't last long-received Covid shot last week

## 2019-06-06 NOTE — ED Provider Notes (Signed)
EKG:  Rhythm: normal sinus rhythm Rate: 91 PR: 143 ms QRS: 80 ms QTc: 433 ms ST segments: normal    Raeford Razor, MD 06/06/19 1316

## 2019-06-06 NOTE — ED Provider Notes (Signed)
[facility] DEPT Provider Note   CSN: [id number] Arrival date & time: 06/06/19  0944     History Chief Complaint  Patient presents with  . Back Pain  . Chest Pain    Glenda Jordan is a 19 y.o. female presenting for evaluation of chest pain and shortness of breath.  Patient states for the past 3 days, she has been having chest pain.  She describes it as a sharp spasm pain that begins in the front of her chest, and radiates to her back.  Initially was intermittent, but is now constant.  Pain is worse when she lays flat, and changes when she takes a deep breath in.  She reports a mild cough which is been present for the past several days.  She reports chills, no fevers.  She denies sore throat, wheezing, nausea, vomiting, abdominal pain, urinary symptoms, normal bowel movements.  She denies history of GERD, reflux, or asthma.  She has not taken anything for her symptoms.  Symptoms began the day after she got her first moderna covid vaccine.  When asked, patient reports she has some mild right calf aching which has been present for the past several days.  No symptoms on the left side.  No swelling or redness.  She reports she recently traveled more than 4 hours, return trip was 2 weeks ago.  Patient states her mom has had 12 unporvoked DVTs, however she personally does not have a history of DVT or PE.  She has been wearing a left wrist splint at work due to wrist pain, but otherwise no immobilization.  She denies recent trauma, injury, recent surgery, history of cancer, or OCP use.  Additionally, patient states she had pneumonia 1 month ago.  She was treated with albuterol, no antibiotics.  Symptoms completely resolved prior to her symptoms that began several days ago.  HPI     History reviewed. No pertinent past medical history.  There are no problems to display for this patient.   History reviewed. No pertinent surgical history.   OB History   No obstetric  history on file.     No family history on file.  Social History   Tobacco Use  . Smoking status: Never Smoker  . Smokeless tobacco: Never Used  Substance Use Topics  . Alcohol use: Never  . Drug use: Never    Home Medications Prior to Admission medications   Medication Sig Start Date End Date Taking? Authorizing Provider  cetirizine-pseudoephedrine (ZYRTEC-D) 5-120 MG tablet Take 1 tablet by mouth daily. Patient not taking: Reported on 03/18/2019 12/21/18   Suella Broad A, PA-C  fluticasone [facility]) 50 MCG/ACT nasal spray Place 1 spray into both nostrils daily. 12/21/18   Tacy Learn, PA-C  naproxen (NAPROSYN) 375 MG tablet Take 1 tablet (375 mg total) by mouth 2 (two) times daily with a meal. 06/06/19   Guelda Batson, PA-C  pantoprazole (PROTONIX) 40 MG tablet Take 40 mg by mouth every morning. 09/30/18   [provider]    Allergies    Amoxicillin  Review of Systems   Review of Systems  Constitutional: Positive for chills.  Respiratory: Positive for cough and shortness of breath.   Cardiovascular: Positive for chest pain.  Musculoskeletal: Positive for myalgias (R calf).  All other systems reviewed and are negative.   Physical Exam Updated Vital Signs BP 125/76   Pulse 91   Temp 98.1 F (36.7 C) (Oral)   Resp 18   LMP 05/20/2019  SpO2 100%   Physical Exam Vitals and nursing note reviewed.  Constitutional:      General: She is not in acute distress.    Appearance: She is well-developed.     Comments: Resting in the bed in no acute distress  HENT:     Head: Normocephalic and atraumatic.  Eyes:     Extraocular Movements: Extraocular movements intact.     Conjunctiva/sclera: Conjunctivae normal.     Pupils: Pupils are equal, round, and reactive to light.  Cardiovascular:     Rate and Rhythm: Normal rate and regular rhythm.     Pulses: Normal pulses.  Pulmonary:     Effort: Pulmonary effort is normal. No respiratory distress.     Breath  sounds: Normal breath sounds. No wheezing.     Comments: Clear lung sounds in all fields.  Tenderness palpation of the chest wall along the sternal border Chest:     Chest wall: Tenderness present.  Abdominal:     General: There is no distension.     Palpations: Abdomen is soft. There is no mass.     Tenderness: There is no abdominal tenderness. There is no guarding or rebound.  Musculoskeletal:        General: Tenderness present. Normal range of motion.     Cervical back: Normal range of motion and neck supple.     Right lower leg: No edema.     Left lower leg: No edema.     Comments: No swelling or abnormality of the right calf when compared to the left.  Pedal pulses 2+ bilaterally.  Patient reports tenderness palpation of the right calf.  Strength and sensation intact bilaterally.  Skin:    General: Skin is warm and dry.     Capillary Refill: Capillary refill takes less than 2 seconds.  Neurological:     Mental Status: She is alert and oriented to person, place, and time.     ED Results / Procedures / Treatments   Labs (all labs ordered are listed, but only abnormal results are displayed) Labs Reviewed  CBC WITH DIFFERENTIAL/PLATELET  BASIC METABOLIC PANEL  D-DIMER, QUANTITATIVE (NOT AT [facility])  I-STAT BETA HCG BLOOD, ED ([facility], [facility], [facility] ONLY)    EKG None  Radiology DG Chest 2 View  Result Date: 06/06/2019 CLINICAL DATA:  Chest and back pain, intermittent since Friday. EXAM: CHEST - 2 VIEW COMPARISON:  Chest x-ray dated 03/18/2019. FINDINGS: Heart size and mediastinal contours are within normal limits. Lungs are clear. No pleural effusion or pneumothorax is seen. Osseous structures about the chest are unremarkable. IMPRESSION: Normal chest x-ray. Electronically Signed   By: Bary Richard M.D.   On: 06/06/2019 12:00    Procedures Procedures (including critical care time)  Medications Ordered in ED Medications  naproxen (NAPROSYN) tablet 500 mg (500 mg Oral Given 06/06/19  1256)    ED Course  I have reviewed the triage vital signs and the nursing notes.  Pertinent labs & imaging results that were available during my care of the patient were reviewed by me and considered in my medical decision making (see chart for details).    MDM Rules/Calculators/A&P                      Patient presenting for evaluation of chest pain, cough, shortness of breath.  On exam, patient is nontoxic.  Pain is reproducible with palpation of the chest wall.  She did recently have pneumonia, consider costochondritis.  However, considering  patient's recent travel, her right calf pain, and her family history of DVTs, will obtain a D-dimer to ensure no PE.  Will obtain labs for evaluation of hemoglobin and electrolytes.  Obtain EKG, but do not believe patient needs troponin as I have extremely low suspicion for ACS at this time.  Chest x-ray ordered to rule out recurrence of pneumonia.  Chest x-ray viewed interpreted by me, no pneumonia pneumothorax and effusion.  EKG without STEMI, NSR.  Labs interpreted by me, overall reassuring.  D-dimer negative, as such low suspicion for PE.  Electrolytes stable.  Hemoglobin stable.  No leukocytosis.  Pain is reproducible with palpation of the anterior chest wall, favor costochondritis.  Will treat with NSAIDs and have patient follow-up with primary care if symptoms not improving.  At this time, patient appears safe for discharge.  Return precautions given.  Patient states she understands and agrees to plan.  Final Clinical Impression(s) / ED Diagnoses Final diagnoses:  Atypical chest pain  Costochondritis    Rx / DC Orders ED Discharge Orders         Ordered    naproxen (NAPROSYN) 375 MG tablet  2 times daily with meals     06/06/19 1306           Erleen Egner, PA-C 06/06/19 1437    Raeford Razor, MD 06/09/19 0800

## 2019-06-06 NOTE — ED Notes (Signed)
Pt ambulatory from triage 

## 2019-07-06 ENCOUNTER — Ambulatory Visit: Payer: BC Managed Care – PPO

## 2020-02-02 ENCOUNTER — Emergency Department (HOSPITAL_COMMUNITY)
Admission: EM | Admit: 2020-02-02 | Discharge: 2020-02-02 | Disposition: A | Discharge status: Home / Self Care | Payer: BC Managed Care – PPO | Attending: Emergency Medicine | Admitting: Emergency Medicine

## 2020-02-02 ENCOUNTER — Encounter (HOSPITAL_COMMUNITY): Payer: Self-pay | Admitting: *Deleted

## 2020-02-02 ENCOUNTER — Other Ambulatory Visit: Payer: Self-pay

## 2020-02-02 DIAGNOSIS — R197 Diarrhea, unspecified: Secondary | ICD-10-CM | POA: Diagnosis not present

## 2020-02-02 DIAGNOSIS — R0981 Nasal congestion: Secondary | ICD-10-CM | POA: Insufficient documentation

## 2020-02-02 DIAGNOSIS — R059 Cough, unspecified: Secondary | ICD-10-CM | POA: Insufficient documentation

## 2020-02-02 DIAGNOSIS — J029 Acute pharyngitis, unspecified: Secondary | ICD-10-CM | POA: Diagnosis not present

## 2020-02-02 DIAGNOSIS — M545 Low back pain, unspecified: Secondary | ICD-10-CM | POA: Insufficient documentation

## 2020-02-02 DIAGNOSIS — Z20822 Contact with and (suspected) exposure to covid-19: Secondary | ICD-10-CM | POA: Insufficient documentation

## 2020-02-02 DIAGNOSIS — J069 Acute upper respiratory infection, unspecified: Secondary | ICD-10-CM

## 2020-02-02 LAB — COMPREHENSIVE METABOLIC PANEL
ALT: 20 U/L (ref 0–44)
AST: 17 U/L (ref 15–41)
Albumin: 4.4 g/dL (ref 3.5–5.0)
Alkaline Phosphatase: 51 U/L (ref 38–126)
Anion gap: 10 (ref 5–15)
BUN: 10 mg/dL (ref 6–20)
CO2: 24 mmol/L (ref 22–32)
Calcium: 9.1 mg/dL (ref 8.9–10.3)
Chloride: 102 mmol/L (ref 98–111)
Creatinine, Ser: 0.88 mg/dL (ref 0.44–1.00)
GFR, Estimated: >60 mL/min (ref >60)
Glucose, Bld: 99 mg/dL (ref 70–99)
Potassium: 3.5 mmol/L (ref 3.5–5.1)
Sodium: 136 mmol/L (ref 135–145)
Total Bilirubin: 0.7 mg/dL (ref 0.3–1.2)
Total Protein: 7.8 g/dL (ref 6.5–8.1)

## 2020-02-02 LAB — RESP PANEL BY RT-PCR (FLU A&B, COVID) ARPGX2
Influenza A by PCR: NEGATIVE
Influenza B by PCR: NEGATIVE
SARS Coronavirus 2 by RT PCR: NEGATIVE

## 2020-02-02 LAB — URINALYSIS, ROUTINE W REFLEX MICROSCOPIC
Bacteria, UA: NONE SEEN
Bilirubin Urine: NEGATIVE
Glucose, UA: NEGATIVE mg/dL
Hgb urine dipstick: NEGATIVE
Ketones, ur: 20 mg/dL — AB
Leukocytes,Ua: NEGATIVE
Nitrite: NEGATIVE
Protein, ur: 30 mg/dL — AB
Specific Gravity, Urine: 1.03 (ref 1.005–1.030)
pH: 6 (ref 5.0–8.0)

## 2020-02-02 LAB — CBC
HCT: 36.3 % (ref 36.0–46.0)
Hemoglobin: 12.5 g/dL (ref 12.0–15.0)
MCH: 30.9 pg (ref 26.0–34.0)
MCHC: 34.4 g/dL (ref 30.0–36.0)
MCV: 89.6 fL (ref 80.0–100.0)
Platelets: 250 10*3/uL (ref 150–400)
RBC: 4.05 MIL/uL (ref 3.87–5.11)
RDW: 12.7 % (ref 11.5–15.5)
WBC: 7 10*3/uL (ref 4.0–10.5)
nRBC: 0 % (ref 0.0–0.2)

## 2020-02-02 LAB — I-STAT BETA HCG BLOOD, ED (MC, WL, AP ONLY): I-stat hCG, quantitative: <5 m[IU]/mL (ref <5)

## 2020-02-02 LAB — LIPASE, BLOOD: Lipase: 23 U/L (ref 11–51)

## 2020-02-02 NOTE — ED Provider Notes (Signed)
[facility]-EMERGENCY DEPT Provider Note   CSN: [id number] Arrival date & time: 02/02/20  1427     History Chief Complaint  Patient presents with  . Diarrhea  . Cough    Gisela Lea is a 19 y.o. female.  Patient is a 19 year old female with no significant past medical history. She presents with a 2-day history of sore throat, cough, and congestion. She also describes some low back pain and loose stools. She denies any bloody stool or urine. She denies fevers or chills. Patient denies any definite ill contacts, but does work at [employer] and is around the [employer].  The history is provided by the patient.       History reviewed. No pertinent past medical history.  There are no problems to display for this patient.   History reviewed. No pertinent surgical history.   OB History   No obstetric history on file.     No family history on file.  Social History   Tobacco Use  . Smoking status: Never Smoker  . Smokeless tobacco: Never Used  Substance Use Topics  . Alcohol use: Never  . Drug use: Never    Home Medications Prior to Admission medications   Medication Sig Start Date End Date Taking? Authorizing Provider  cetirizine-pseudoephedrine (ZYRTEC-D) 5-120 MG tablet Take 1 tablet by mouth daily. Patient not taking: Reported on 03/18/2019 12/21/18   Army Melia A, PA-C  fluticasone [facility]) 50 MCG/ACT nasal spray Place 1 spray into both nostrils daily. 12/21/18   Jeannie Fend, PA-C  naproxen (NAPROSYN) 375 MG tablet Take 1 tablet (375 mg total) by mouth 2 (two) times daily with a meal. 06/06/19   Caccavale, Sophia, PA-C  pantoprazole (PROTONIX) 40 MG tablet Take 40 mg by mouth every morning. 09/30/18   [provider]    Allergies    Amoxicillin  Review of Systems   Review of Systems  All other systems reviewed and are negative.   Physical Exam Updated Vital Signs BP 108/86   Pulse 85   Temp 98.8 F (37.1 C) (Oral)    Resp 16   Ht 5\' 3"  (1.6 m)   Wt 87.6 kg   LMP 01/12/2020   SpO2 100%   BMI 34.21 kg/m   Physical Exam Vitals and nursing note reviewed.  Constitutional:      General: She is not in acute distress.    Appearance: She is well-developed and well-nourished. She is not diaphoretic.  HENT:     Head: Normocephalic and atraumatic.     Mouth/Throat:     Mouth: Mucous membranes are moist.     Pharynx: No oropharyngeal exudate or posterior oropharyngeal erythema.  Cardiovascular:     Rate and Rhythm: Normal rate and regular rhythm.     Heart sounds: No murmur heard. No friction rub. No gallop.   Pulmonary:     Effort: Pulmonary effort is normal. No respiratory distress.     Breath sounds: Normal breath sounds. No wheezing.  Abdominal:     General: Bowel sounds are normal. There is no distension.     Palpations: Abdomen is soft.     Tenderness: There is no abdominal tenderness.  Musculoskeletal:        General: Normal range of motion.     Cervical back: Normal range of motion and neck supple. No rigidity or tenderness.  Lymphadenopathy:     Cervical: No cervical adenopathy.  Skin:    General: Skin is warm and dry.  Neurological:     Mental Status: She is alert and oriented to person, place, and time.     ED Results / Procedures / Treatments   Labs (all labs ordered are listed, but only abnormal results are displayed) Labs Reviewed  RESP PANEL BY RT-PCR (FLU A&B, COVID) ARPGX2  LIPASE, BLOOD  COMPREHENSIVE METABOLIC PANEL  CBC  URINALYSIS, ROUTINE W REFLEX MICROSCOPIC  I-STAT BETA HCG BLOOD, ED ([facility], [facility], [facility] ONLY)    EKG None  Radiology No results found.  Procedures Procedures (including critical care time)  Medications Ordered in ED Medications - No data to display  ED Course  I have reviewed the triage vital signs and the nursing notes.  Pertinent labs & imaging results that were available during my care of the patient were reviewed by me and considered in my  medical decision making (see chart for details).    MDM Rules/Calculators/A&P  Patient presenting here with complaints of URI symptoms for the past 2 days.  Patient's vitals are stable and Covid test is negative.  Remainder of laboratory studies are unremarkable.  Patient seems appropriate for discharge.  She will be advised to take plenty of fluids, rest, and follow-up as needed.  Final Clinical Impression(s) / ED Diagnoses Final diagnoses:  None    Rx / DC Orders ED Discharge Orders    None       Geoffery Lyons, MD 02/02/20 2021

## 2020-02-02 NOTE — ED Triage Notes (Signed)
Pt reports diarrhea, cough, sore throat, and cough x 2 days. Pt has lost her appetite and reports food doesn't taste the same but denies loss of taste.

## 2020-02-02 NOTE — Discharge Instructions (Addendum)
Drink plenty of fluids and get plenty of rest.  Take over-the-counter medications as needed for relief of symptoms.  Return to the emergency department if you develop difficulty breathing, severe chest pain, or other new and concerning symptoms.

## 2020-07-31 ENCOUNTER — Emergency Department (HOSPITAL_COMMUNITY): Payer: BC Managed Care – PPO

## 2020-07-31 ENCOUNTER — Emergency Department (HOSPITAL_COMMUNITY)
Admission: EM | Admit: 2020-07-31 | Discharge: 2020-07-31 | Disposition: A | Discharge status: Home / Self Care | Payer: BC Managed Care – PPO | Attending: Emergency Medicine | Admitting: Emergency Medicine

## 2020-07-31 ENCOUNTER — Other Ambulatory Visit: Payer: Self-pay

## 2020-07-31 ENCOUNTER — Encounter (HOSPITAL_COMMUNITY): Payer: Self-pay

## 2020-07-31 DIAGNOSIS — R0781 Pleurodynia: Secondary | ICD-10-CM | POA: Diagnosis not present

## 2020-07-31 DIAGNOSIS — G43909 Migraine, unspecified, not intractable, without status migrainosus: Secondary | ICD-10-CM | POA: Diagnosis not present

## 2020-07-31 DIAGNOSIS — R110 Nausea: Secondary | ICD-10-CM | POA: Diagnosis present

## 2020-07-31 LAB — CBC
HCT: 36.2 % (ref 36.0–46.0)
Hemoglobin: 12.2 g/dL (ref 12.0–15.0)
MCH: 31.2 pg (ref 26.0–34.0)
MCHC: 33.7 g/dL (ref 30.0–36.0)
MCV: 92.6 fL (ref 80.0–100.0)
Platelets: 267 10*3/uL (ref 150–400)
RBC: 3.91 MIL/uL (ref 3.87–5.11)
RDW: 13.3 % (ref 11.5–15.5)
WBC: 7.3 10*3/uL (ref 4.0–10.5)
nRBC: 0 % (ref 0.0–0.2)

## 2020-07-31 LAB — COMPREHENSIVE METABOLIC PANEL
ALT: 10 U/L (ref 0–44)
AST: 12 U/L — ABNORMAL LOW (ref 15–41)
Albumin: 4 g/dL (ref 3.5–5.0)
Alkaline Phosphatase: 45 U/L (ref 38–126)
Anion gap: 5 (ref 5–15)
BUN: 12 mg/dL (ref 6–20)
CO2: 29 mmol/L (ref 22–32)
Calcium: 8.9 mg/dL (ref 8.9–10.3)
Chloride: 104 mmol/L (ref 98–111)
Creatinine, Ser: 0.9 mg/dL (ref 0.44–1.00)
GFR, Estimated: >60 mL/min (ref >60)
Glucose, Bld: 98 mg/dL (ref 70–99)
Potassium: 3.7 mmol/L (ref 3.5–5.1)
Sodium: 138 mmol/L (ref 135–145)
Total Bilirubin: 0.3 mg/dL (ref 0.3–1.2)
Total Protein: 7.6 g/dL (ref 6.5–8.1)

## 2020-07-31 LAB — I-STAT BETA HCG BLOOD, ED (MC, WL, AP ONLY): I-stat hCG, quantitative: <5 m[IU]/mL (ref <5)

## 2020-07-31 LAB — LIPASE, BLOOD: Lipase: 31 U/L (ref 11–51)

## 2020-07-31 LAB — TROPONIN I (HIGH SENSITIVITY): Troponin I (High Sensitivity): <2 ng/L (ref <18)

## 2020-07-31 MED ORDER — KETOROLAC TROMETHAMINE 60 MG/2ML IM SOLN
60.0000 mg | Freq: Once | INTRAMUSCULAR | Status: AC
  Administered 2020-07-31: 21:00:00 60 mg via INTRAMUSCULAR
  Filled 2020-07-31: qty 2

## 2020-07-31 MED ORDER — CYCLOBENZAPRINE HCL 10 MG PO TABS: 10.0000 mg | ORAL_TABLET | Freq: Two times a day (BID) | ORAL | 0 refills | Status: AC | PRN

## 2020-07-31 MED ORDER — IBUPROFEN 600 MG PO TABS: 600.0000 mg | ORAL_TABLET | Freq: Three times a day (TID) | ORAL | 0 refills | Status: AC | PRN

## 2020-07-31 NOTE — ED Triage Notes (Signed)
Patient c/o left flank pain x 1 month. Patient c/o intermittent mid chest pain x 2 weeks. Patient states she gets SOB x 1 week. Patient states SOB occurs sometimes when she is just sitting. Patient c/o a migraine x 1 week. Patient is light sensitive. Patient also c/o nausea

## 2020-07-31 NOTE — ED Provider Notes (Signed)
[facility] EMERGENCY DEPARTMENT Provider Note  CSN: [id number] Arrival date & time: 07/31/20 1431    History Chief Complaint  Patient presents with   Migraine      Glenda Jordan is a 20 y.o. female with multiple complaints, chief of which is a headache off and on for about a week, posterior, non radiating, similar to previous, associated with scotoma, nausea and photophobia. Does not have frequent headaches but has been to Neurology in the past. She also has had several weeks of pleuritic chest pain and L lower rib/flank pain worse with coughing and deep breath. Not associated with fever, N/V, diaphoresis. Symptoms come and go. No recent long distance travel. She does not smoke or take OCPs. No leg swelling.    History reviewed. No pertinent past medical history.  History reviewed. No pertinent surgical history.  Family History  Problem Relation Age of Onset   Deep vein thrombosis Mother     Social History   Tobacco Use   Smoking status: Never   Smokeless tobacco: Never  Vaping Use   Vaping Use: Never used  Substance Use Topics   Alcohol use: Never   Drug use: Never     Home Medications Prior to Admission medications   Medication Sig Start Date End Date Taking? Authorizing Provider  cyclobenzaprine (FLEXERIL) 10 MG tablet Take 1 tablet (10 mg total) by mouth 2 (two) times daily as needed for muscle spasms. 07/31/20  Yes Pollyann Savoy, MD  ibuprofen (ADVIL) 600 MG tablet Take 1 tablet (600 mg total) by mouth every 8 (eight) hours as needed. 07/31/20  Yes Pollyann Savoy, MD  acetaminophen (TYLENOL) 500 MG tablet Take 500 mg by mouth every 6 (six) hours as needed for headache.    [provider]  cetirizine-pseudoephedrine (ZYRTEC-D) 5-120 MG tablet Take 1 tablet by mouth daily. Patient not taking: No sig reported 12/21/18   Army Melia A, PA-C  fluticasone [facility]) 50 MCG/ACT nasal spray Place 1 spray into both nostrils daily. Patient not taking:  Reported on 02/02/2020 12/21/18   Jeannie Fend, PA-C  naproxen (NAPROSYN) 375 MG tablet Take 1 tablet (375 mg total) by mouth 2 (two) times daily with a meal. Patient not taking: Reported on 02/02/2020 06/06/19   Caccavale, Sophia, PA-C     Allergies    Amoxicillin   Review of Systems   Review of Systems  Cardiovascular:  Positive for chest pain.  Genitourinary:  Positive for flank pain.  A comprehensive review of systems was completed and negative except as noted in HPI.    Physical Exam BP 129/79   Pulse 79   Temp 98.9 F (37.2 C) (Oral)   Resp 18   Ht 5\' 4"  (1.626 m)   Wt 77.1 kg   LMP 07/02/2020 (Approximate)   SpO2 100%   BMI 29.18 kg/m   Physical Exam Vitals and nursing note reviewed.  Constitutional:      Appearance: Normal appearance.  HENT:     Head: Normocephalic and atraumatic.     Nose: Nose normal.     Mouth/Throat:     Mouth: Mucous membranes are moist.  Eyes:     Extraocular Movements: Extraocular movements intact.     Conjunctiva/sclera: Conjunctivae normal.  Cardiovascular:     Rate and Rhythm: Normal rate.  Pulmonary:     Effort: Pulmonary effort is normal.     Breath sounds: Normal breath sounds.  Chest:     Chest wall: Tenderness present.  Abdominal:  General: Abdomen is flat.     Palpations: Abdomen is soft.     Tenderness: There is no abdominal tenderness.  Musculoskeletal:        General: No swelling. Normal range of motion.     Cervical back: Neck supple.  Skin:    General: Skin is warm and dry.  Neurological:     General: No focal deficit present.     Mental Status: She is alert.  Psychiatric:        Mood and Affect: Mood normal.     ED Results / Procedures / Treatments   Labs (all labs ordered are listed, but only abnormal results are displayed) Labs Reviewed  COMPREHENSIVE METABOLIC PANEL - Abnormal; Notable for the following components:      Result Value   AST 12 (*)    All other components within normal limits   CBC  LIPASE, BLOOD  I-STAT BETA HCG BLOOD, ED ([facility], [facility], [facility] ONLY)  TROPONIN I (HIGH SENSITIVITY)    EKG EKG Interpretation  Date/Time:  Monday July 31 2020 14:56:49 EDT Ventricular Rate:  82 PR Interval:  151 QRS Duration: 81 QT Interval:  364 QTC Calculation: 426 R Axis:   43 Text Interpretation: Sinus rhythm Normal ECG No old tracing to compare Confirmed by Susy Frizzle [phone]) on 07/31/2020 8:45:50 PM  Radiology DG Chest 2 View  Result Date: 07/31/2020 CLINICAL DATA:  Chest pain and left flank pain over the last month. EXAM: CHEST - 2 VIEW COMPARISON:  06/06/2019 FINDINGS: Heart size is normal. Mediastinal shadows are normal. The lungs are clear. No bronchial thickening. No infiltrate, mass, effusion or collapse. Pulmonary vascularity is normal. No bony abnormality. IMPRESSION: Normal chest Electronically Signed   By: Paulina Fusi M.D.   On: 07/31/2020 15:15    Procedures Procedures  Medications Ordered in the ED Medications  ketorolac (TORADOL) injection 60 mg (60 mg Intramuscular Given 07/31/20 2057)     MDM Rules/Calculators/A&P MDM  Patient with benign sounding headache. No red flags. Neuro exam is normal. Her other complaints are subacute, unremarkable labs and CXR from triage. Will give IM toradol and reassess.   ED Course  I have reviewed the triage vital signs and the nursing notes.  Pertinent labs & imaging results that were available during my care of the patient were reviewed by me and considered in my medical decision making (see chart for details).  Clinical Course as of 07/31/20 2143  Mon Jul 31, 2020  2140 Patient reports resolution of her headache. Will d/c with muscle relaxer Rx as this has helped her in the past. NSAIDs as needed. Neurology follow up if not improving.  [CS]    Clinical Course User Index [CS] Pollyann Savoy, MD    Final Clinical Impression(s) / ED Diagnoses Final diagnoses:  Migraine without status migrainosus, not  intractable, unspecified migraine type  Pleuritic chest pain    Rx / DC Orders ED Discharge Orders          Ordered    ibuprofen (ADVIL) 600 MG tablet  Every 8 hours PRN        07/31/20 2143    cyclobenzaprine (FLEXERIL) 10 MG tablet  2 times daily PRN        07/31/20 2143             Pollyann Savoy, MD 07/31/20 2143

## 2020-07-31 NOTE — ED Provider Notes (Signed)
Emergency Medicine Provider Triage Evaluation Note  Glenda Jordan , a 20 y.o. female  was evaluated in triage.  Pt complains of multiple complaints. She is c/o headaches for 1 week. She further c/o chest pain, sob and luq/left flank pain x13month.  Review of Systems  Positive: Headache, chest pain, sob, flank pain, urinary frequency, cough Negative: Dysuria, nvd  Physical Exam  BP 133/84   Temp 98.9 F (37.2 C) (Oral)   Resp 16   Ht 5\' 4"  (1.626 m)   Wt 77.1 kg   LMP 07/02/2020 (Approximate)   SpO2 99%   BMI 29.18 kg/m  Gen:   Awake, no distress   Resp:  Normal effort  MSK:   Moves extremities without difficulty  Other:  Lungs ctab, heart with rrr  Medical Decision Making  Medically screening exam initiated at 3:29 PM.  Appropriate orders placed.  Willard Madrigal was informed that the remainder of the evaluation will be completed by another provider, this initial triage assessment does not replace that evaluation, and the importance of remaining in the ED until their evaluation is complete.     Margit Hanks 07/31/20 1532    08/02/20, MD 08/01/20 1003

## 2020-08-19 ENCOUNTER — Emergency Department (HOSPITAL_COMMUNITY)
Admission: EM | Admit: 2020-08-19 | Discharge: 2020-08-19 | Disposition: A | Discharge status: Home / Self Care | Payer: BC Managed Care – PPO | Attending: Emergency Medicine | Admitting: Emergency Medicine

## 2020-08-19 ENCOUNTER — Encounter (HOSPITAL_COMMUNITY): Payer: Self-pay | Admitting: Emergency Medicine

## 2020-08-19 ENCOUNTER — Other Ambulatory Visit: Payer: Self-pay

## 2020-08-19 DIAGNOSIS — R519 Headache, unspecified: Secondary | ICD-10-CM | POA: Insufficient documentation

## 2020-08-19 DIAGNOSIS — Z20822 Contact with and (suspected) exposure to covid-19: Secondary | ICD-10-CM | POA: Diagnosis not present

## 2020-08-19 DIAGNOSIS — J029 Acute pharyngitis, unspecified: Secondary | ICD-10-CM | POA: Insufficient documentation

## 2020-08-19 DIAGNOSIS — R059 Cough, unspecified: Secondary | ICD-10-CM | POA: Diagnosis not present

## 2020-08-19 DIAGNOSIS — R0989 Other specified symptoms and signs involving the circulatory and respiratory systems: Secondary | ICD-10-CM | POA: Diagnosis not present

## 2020-08-19 DIAGNOSIS — Z5321 Procedure and treatment not carried out due to patient leaving prior to being seen by health care provider: Secondary | ICD-10-CM | POA: Diagnosis not present

## 2020-08-19 LAB — GROUP A STREP BY PCR: Group A Strep by PCR: NOT DETECTED

## 2020-08-19 LAB — RESP PANEL BY RT-PCR (FLU A&B, COVID) ARPGX2
Influenza A by PCR: NEGATIVE
Influenza B by PCR: NEGATIVE
SARS Coronavirus 2 by RT PCR: NEGATIVE

## 2020-08-19 NOTE — ED Notes (Signed)
Called pt x3 for room, no response. 

## 2020-08-19 NOTE — ED Triage Notes (Signed)
C/o sore throat, headache, runny nose, and cough x 3 days.

## 2020-08-20 NOTE — ED Provider Notes (Signed)
Patient left without being seen. I didn't establish care with patient    Glenda Pander, MD 08/20/20 2337

## 2021-09-30 IMAGING — CR DG CHEST 2V
2 series · 2 of 2 positions shown · non-contrast
Comparison: 06/06/2019

CLINICAL DATA: Chest pain and left flank pain over the last month.

EXAM:
CHEST - 2 VIEW

[w chest pa]
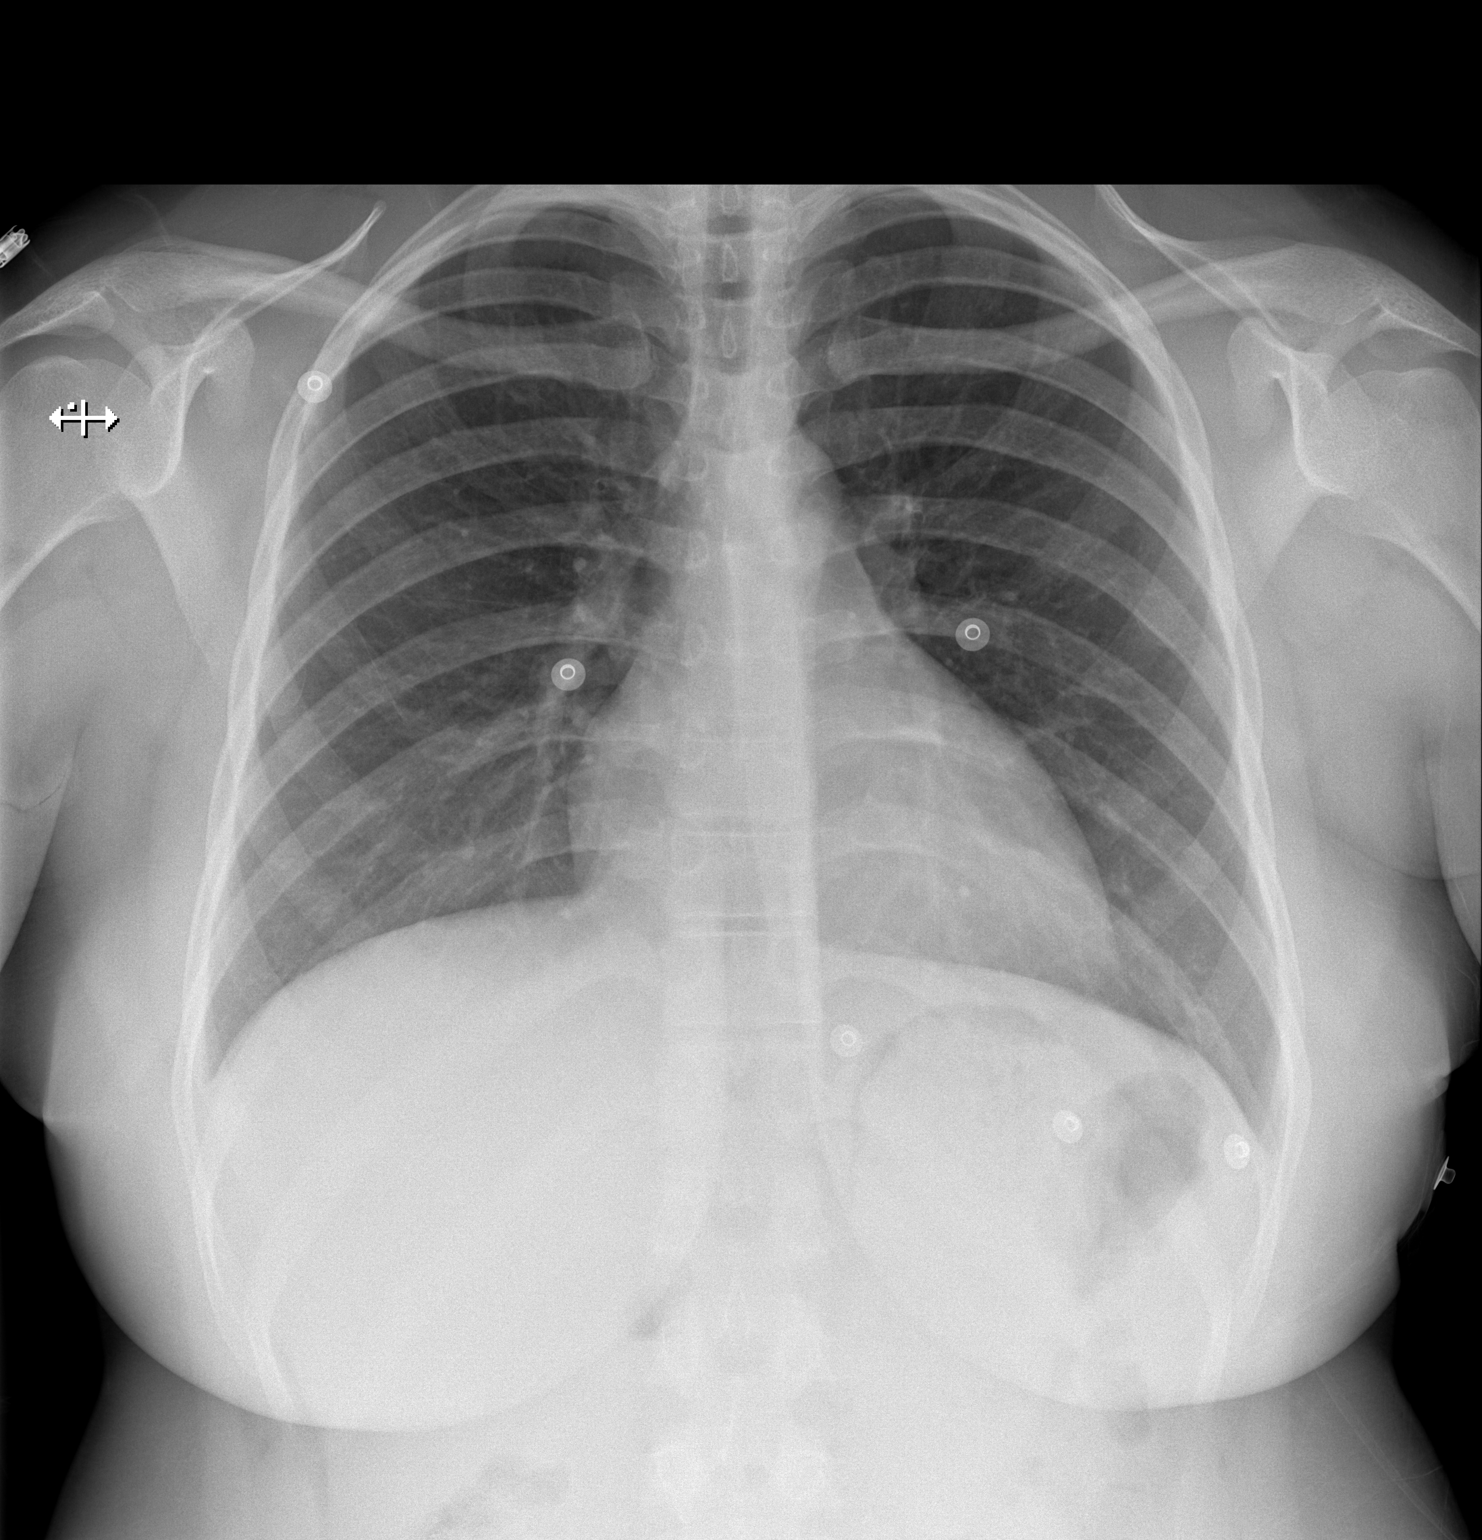

[w chest lat]
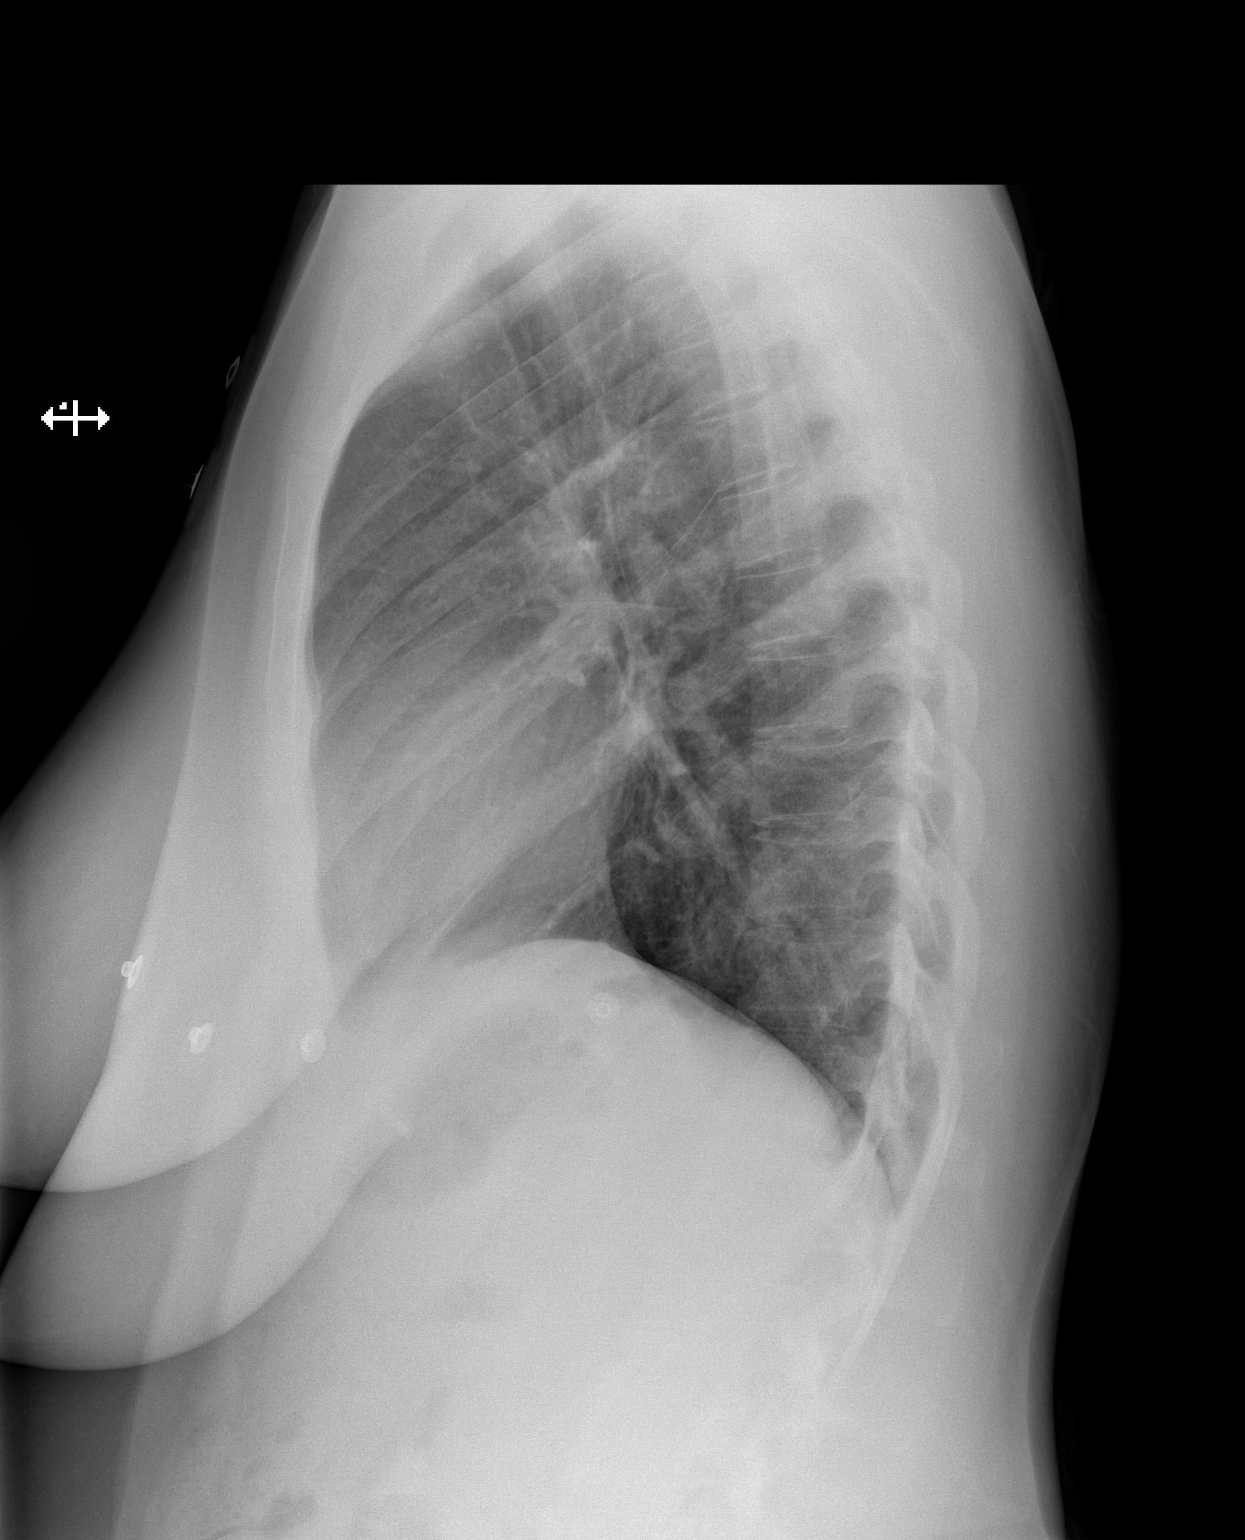

[2 of 2 positions shown; findings below may reference images not displayed]

FINDINGS: Heart size is normal. Mediastinal shadows are normal. The lungs are
clear. No bronchial thickening. No infiltrate, mass, effusion or
collapse. Pulmonary vascularity is normal. No bony abnormality.
IMPRESSION: Normal chest
